# Patient Record
Sex: Male | Born: 1980 | Race: Black or African American | Hispanic: No | Marital: Single | Smoking: Current every day smoker
Health system: Southern US, Community
[De-identification: ages and names within clinical notes are randomized; demographics above are authoritative.]

## PROBLEM LIST (undated history)

## (undated) DIAGNOSIS — W3400XA Accidental discharge from unspecified firearms or gun, initial encounter: Secondary | ICD-10-CM

## (undated) HISTORY — PX: EYE SURGERY: SHX253

---
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
## 2015-05-22 ENCOUNTER — Emergency Department (HOSPITAL_COMMUNITY)
Admission: EM | Admit: 2015-05-22 | Discharge: 2015-05-22 | Disposition: A | Discharge status: Home / Self Care | Payer: Self-pay | Attending: Emergency Medicine | Admitting: Emergency Medicine

## 2015-05-22 ENCOUNTER — Encounter (HOSPITAL_COMMUNITY): Payer: Self-pay

## 2015-05-22 DIAGNOSIS — S41112A Laceration without foreign body of left upper arm, initial encounter: Secondary | ICD-10-CM | POA: Insufficient documentation

## 2015-05-22 DIAGNOSIS — W25XXXA Contact with sharp glass, initial encounter: Secondary | ICD-10-CM | POA: Insufficient documentation

## 2015-05-22 DIAGNOSIS — Y998 Other external cause status: Secondary | ICD-10-CM | POA: Insufficient documentation

## 2015-05-22 DIAGNOSIS — Y9389 Activity, other specified: Secondary | ICD-10-CM | POA: Insufficient documentation

## 2015-05-22 DIAGNOSIS — Y9289 Other specified places as the place of occurrence of the external cause: Secondary | ICD-10-CM | POA: Insufficient documentation

## 2015-05-22 DIAGNOSIS — Z720 Tobacco use: Secondary | ICD-10-CM | POA: Insufficient documentation

## 2015-05-22 HISTORY — DX: Accidental discharge from unspecified firearms or gun, initial encounter: W34.00XA

## 2015-05-22 MED ORDER — LIDOCAINE-EPINEPHRINE 1 %-1:100000 IJ SOLN
10.0000 mL | Freq: Once | INTRAMUSCULAR | Status: AC
Start: 2015-05-22 — End: 2015-05-22
  Administered 2015-05-22: 10 mL
  Filled 2015-05-22: qty 1

## 2015-05-22 MED ORDER — LIDOCAINE-EPINEPHRINE 1 %-1:100000 IJ SOLN: 20.0000 mL | Freq: Once | INTRAMUSCULAR | Status: DC
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
## 2015-05-22 NOTE — ED Notes (Signed)
Pt. From home. Pt. Cut L forearm on broken window glass. Denies pain at this time. Tetanus updated 1 year ago. Bleeding controlled. Pt. Cleaned arm at home prior to EMS arrival.

## 2015-05-22 NOTE — Discharge Instructions (Signed)
Keep wound area clean. Refer to attached documents for more information. Return to the ED with worsening or concerning symptoms.  °

## 2015-12-13 ENCOUNTER — Emergency Department (HOSPITAL_COMMUNITY): Payer: Self-pay

## 2015-12-13 ENCOUNTER — Encounter (HOSPITAL_COMMUNITY): Payer: Self-pay | Admitting: *Deleted

## 2015-12-13 ENCOUNTER — Emergency Department (HOSPITAL_COMMUNITY)
Admission: EM | Admit: 2015-12-13 | Discharge: 2015-12-13 | Disposition: A | Discharge status: Home / Self Care | Payer: Self-pay | Attending: Emergency Medicine | Admitting: Emergency Medicine

## 2015-12-13 DIAGNOSIS — S52592A Other fractures of lower end of left radius, initial encounter for closed fracture: Secondary | ICD-10-CM | POA: Insufficient documentation

## 2015-12-13 DIAGNOSIS — S62102A Fracture of unspecified carpal bone, left wrist, initial encounter for closed fracture: Secondary | ICD-10-CM

## 2015-12-13 DIAGNOSIS — Z87828 Personal history of other (healed) physical injury and trauma: Secondary | ICD-10-CM | POA: Insufficient documentation

## 2015-12-13 DIAGNOSIS — Y9289 Other specified places as the place of occurrence of the external cause: Secondary | ICD-10-CM | POA: Insufficient documentation

## 2015-12-13 DIAGNOSIS — F17200 Nicotine dependence, unspecified, uncomplicated: Secondary | ICD-10-CM | POA: Insufficient documentation

## 2015-12-13 DIAGNOSIS — W1839XA Other fall on same level, initial encounter: Secondary | ICD-10-CM | POA: Insufficient documentation

## 2015-12-13 DIAGNOSIS — Y9389 Activity, other specified: Secondary | ICD-10-CM | POA: Insufficient documentation

## 2015-12-13 DIAGNOSIS — R52 Pain, unspecified: Secondary | ICD-10-CM

## 2015-12-13 DIAGNOSIS — X500XXA Overexertion from strenuous movement or load, initial encounter: Secondary | ICD-10-CM | POA: Insufficient documentation

## 2015-12-13 DIAGNOSIS — N451 Epididymitis: Secondary | ICD-10-CM | POA: Insufficient documentation

## 2015-12-13 DIAGNOSIS — Y998 Other external cause status: Secondary | ICD-10-CM | POA: Insufficient documentation

## 2015-12-13 LAB — CBC WITH DIFFERENTIAL/PLATELET
BASOS ABS: 0 10*3/uL (ref 0.0–0.1)
Basophils Relative: 0 %
EOS PCT: 1 %
Eosinophils Absolute: 0.1 10*3/uL (ref 0.0–0.7)
HCT: 46.9 % (ref 39.0–52.0)
HEMOGLOBIN: 16 g/dL (ref 13.0–17.0)
Lymphocytes Relative: 10 %
Lymphs Abs: 0.9 10*3/uL (ref 0.7–4.0)
MCH: 33.7 pg (ref 26.0–34.0)
MCHC: 34.1 g/dL (ref 30.0–36.0)
MCV: 98.7 fL (ref 78.0–100.0)
Monocytes Absolute: 0.9 10*3/uL (ref 0.1–1.0)
Monocytes Relative: 10 %
Neutro Abs: 7.6 10*3/uL (ref 1.7–7.7)
Neutrophils Relative %: 79 %
PLATELETS: 208 10*3/uL (ref 150–400)
RBC: 4.75 MIL/uL (ref 4.22–5.81)
RDW: 12 % (ref 11.5–15.5)
WBC: 9.5 10*3/uL (ref 4.0–10.5)

## 2015-12-13 LAB — I-STAT CHEM 8, ED
BUN: 10 mg/dL (ref 6–20)
CALCIUM ION: 1.1 mmol/L — AB (ref 1.12–1.23)
CHLORIDE: 99 mmol/L — AB (ref 101–111)
Creatinine, Ser: 1.1 mg/dL (ref 0.61–1.24)
Glucose, Bld: 102 mg/dL — ABNORMAL HIGH (ref 65–99)
HCT: 51 % (ref 39.0–52.0)
Hemoglobin: 17.3 g/dL — ABNORMAL HIGH (ref 13.0–17.0)
POTASSIUM: 3.3 mmol/L — AB (ref 3.5–5.1)
Sodium: 139 mmol/L (ref 135–145)
TCO2: 28 mmol/L (ref 0–100)

## 2015-12-13 LAB — URINALYSIS, ROUTINE W REFLEX MICROSCOPIC
Glucose, UA: NEGATIVE mg/dL
Ketones, ur: 15 mg/dL — AB
Nitrite: NEGATIVE
PH: 7 (ref 5.0–8.0)
Protein, ur: 30 mg/dL — AB
SPECIFIC GRAVITY, URINE: 1.033 — AB (ref 1.005–1.030)

## 2015-12-13 LAB — URINE MICROSCOPIC-ADD ON: BACTERIA UA: NONE SEEN

## 2015-12-13 MED ORDER — DOXYCYCLINE HYCLATE 100 MG PO CAPS: 100.0000 mg | ORAL_CAPSULE | Freq: Two times a day (BID) | ORAL | Status: AC

## 2015-12-13 MED ORDER — AZITHROMYCIN 250 MG PO TABS
1000.0000 mg | ORAL_TABLET | Freq: Once | ORAL | Status: AC
  Administered 2015-12-13: 1000 mg via ORAL
  Filled 2015-12-13: qty 4

## 2015-12-13 MED ORDER — CEFTRIAXONE SODIUM 250 MG IJ SOLR
250.0000 mg | Freq: Once | INTRAMUSCULAR | Status: AC
  Administered 2015-12-13: 250 mg via INTRAMUSCULAR
  Filled 2015-12-13: qty 250

## 2015-12-13 MED ORDER — HYDROCODONE-ACETAMINOPHEN 5-325 MG PO TABS
1.0000 | ORAL_TABLET | Freq: Once | ORAL | Status: AC
  Administered 2015-12-13: 1 via ORAL
  Filled 2015-12-13: qty 1

## 2015-12-13 MED ORDER — LIDOCAINE HCL (PF) 1 % IJ SOLN
INTRAMUSCULAR | Status: AC
  Administered 2015-12-13: 5 mL
  Filled 2015-12-13: qty 5

## 2015-12-13 MED ORDER — HYDROCODONE-ACETAMINOPHEN 5-325 MG PO TABS: 1.0000 | ORAL_TABLET | Freq: Four times a day (QID) | ORAL | Status: AC | PRN

## 2015-12-13 NOTE — Discharge Instructions (Signed)
Take antibiotic as prescribed as treatment for epididymitis.  Also, follow up with hand specialist next week for further management of your broken wrist.  Take pain medication as needed.  Epididymitis Epididymitis is swelling (inflammation) of the epididymis. The epididymis is a cord-like structure that is located along the top and back part of the testicle. It collects and stores sperm from the testicle. This condition can also cause pain and swelling of the testicle and scrotum. Symptoms usually start suddenly (acute epididymitis). Sometimes epididymitis starts gradually and lasts for a while (chronic epididymitis). This type may be harder to treat. CAUSES In men 75 and younger, this condition is usually caused by a bacterial infection or sexually transmitted disease (STD), such as:  Gonorrhea.  Chlamydia.  In men 80 and older who do not have anal sex, this condition is usually caused by bacteria from a blockage or abnormalities in the urinary system. These can result from:  Having a tube placed into the bladder (urinary catheter).  Having an enlarged or inflamed prostate gland.  Having recent urinary tract surgery. In men who have a condition that weakens the body's defense system (immune system), such as HIV, this condition can be caused by:   Other bacteria, including tuberculosis and syphilis.  Viruses.  Fungi. Sometimes this condition occurs without infection. That may happen if urine flows backward into the epididymis after heavy lifting or straining. RISK FACTORS This condition is more likely to develop in men:  Who have unprotected sex with more than one partner.  Who have anal sex.   Who have recently had surgery.   Who have a urinary catheter.  Who have urinary problems.  Who have a suppressed immune system. SYMPTOMS  This condition usually begins suddenly with chills, fever, and pain behind the scrotum and in the testicle. Other symptoms include:    Swelling of the scrotum, testicle, or both.  Pain whenejaculatingor urinating.  Pain in the back or belly.  Nausea.  Itching and discharge from the penis.  Frequent need to pass urine.  Redness and tenderness of the scrotum. DIAGNOSIS Your health care provider can diagnose this condition based on your symptoms and medical history. Your health care provider will also do a physical exam to ask about your symptoms and check your scrotum and testicle for swelling, pain, and redness. You may also have other tests, including:   Examination of discharge from the penis.  Urine tests for infections, such as STDs.  Your health care provider may test you for other STDs, including HIV. TREATMENT Treatment for this condition depends on the cause. If your condition is caused by a bacterial infection, oral antibiotic medicine may be prescribed. If the bacterial infection has spread to your blood, you may need to receive IV antibiotics. Nonbacterial epididymitis is treated with home care that includes bed rest and elevation of the scrotum. Surgery may be needed to treat:  Bacterial epididymitis that causes pus to build up in the scrotum (abscess).  Chronic epididymitis that has not responded to other treatments. HOME CARE INSTRUCTIONS Medicines  Take over-the-counter and prescription medicines only as told by your health care provider.   If you were prescribed an antibiotic medicine, take it as told by your health care provider. Do not stop taking the antibiotic even if your condition improves. Sexual Activity  If your epididymitis was caused by an STD, avoid sexual activity until your treatment is complete.  Inform your sexual partner or partners if you test positive for an STD.  They may need to be treated.Do not engage in sexual activity with your partner or partners until their treatment is completed. General Instructions  Return to your normal activities as told by your  health care provider. Ask your health care provider what activities are safe for you.  Keep your scrotum elevated and supported while resting. Ask your health care provider if you should wear a scrotal support, such as a jockstrap. Wear it as told by your health care provider.  If directed, apply ice to the affected area:   Put ice in a plastic bag.  Place a towel between your skin and the bag.  Leave the ice on for 20 minutes, 2-3 times per day.  Try taking a sitz bath to help with discomfort. This is a warm water bath that is taken while you are sitting down. The water should only come up to your hips and should cover your buttocks. Do this 3-4 times per day or as told by your health care provider.  Keep all follow-up visits as told by your health care provider. This is important. SEEK MEDICAL CARE IF:   You have a fever.   Your pain medicine is not helping.   Your pain is getting worse.   Your symptoms do not improve within three days.   This information is not intended to replace advice given to you by your health care provider. Make sure you discuss any questions you have with your health care provider.   Document Released: 12/13/2000 Document Revised: 09/06/2015 Document Reviewed: 05/03/2015 Elsevier Interactive Patient Education 2016 Elsevier Inc.  Wrist Fracture A wrist fracture is a break or crack in one of the bones of your wrist. Your wrist is made up of eight small bones at the palm of your hand (carpal bones) and two long bones that make up your forearm (radius and ulna). The goal of treatment is to hold the injured bone in place while it heals. Surgery may or may not be needed to care for your injured wrist.  HOME CARE  Keep your injured wrist raised (elevated). Move your fingers as much as you can.  Do not put pressure on any part of your cast or splint. It may break.  Use a plastic bag to protect your cast or splint from water while bathing or showering. Do  not lower your cast or splint into water.  Take medicines only as told by your doctor.  Keep your cast or splint clean and dry. If it gets wet, damaged, or suddenly feels too tight, tell your doctor right away.  Do not use any tobacco products including cigarettes, chewing tobacco, or electronic cigarettes. Tobacco can slow bone healing. If you need help quitting, ask your doctor.  Keep all follow-up visits as told by your doctor. This is important.  Ask your doctor if you should take supplements of calcium and vitamins C and D. GET HELP IF:   Your cast or splint is damaged, breaks, or gets wet.  You have a fever.  You have chills.  You have very bad pain that does not go away.  You have more swelling (inflammation) than before the cast was put on. GET HELP RIGHT AWAY IF:   Your hand or fingernails on the injured arm turn blue or gray, or feel cold or numb.  You lose some feeling in the fingers of your injured arm. MAKE SURE YOU:   Understand these instructions.  Will watch your condition.  Will get help right  away if you are not doing well or get worse.   This information is not intended to replace advice given to you by your health care provider. Make sure you discuss any questions you have with your health care provider.   Document Released: 06/03/2008 Document Revised: 01/06/2015 Document Reviewed: 06/29/2012 Elsevier Interactive Patient Education [employer]2016 Elsevier Inc.

## 2015-12-13 NOTE — ED Notes (Signed)
US at bedside  Pt talking loudly on the phone, talking to someone saying "I'm not playing with your, I will come deep to you".

## 2015-12-13 NOTE — ED Notes (Signed)
Ortho tech at bedside 

## 2015-12-13 NOTE — ED Notes (Signed)
Patient transported to X-ray 

## 2015-12-13 NOTE — ED Notes (Signed)
PA at bedside Pt alert and oriented x4. Respirations even and unlabored, bilateral symmetrical rise and fall of chest. Skin warm and dry. In no acute distress. Denies needs.   

## 2015-12-13 NOTE — ED Notes (Signed)
Unable to collect labs patient is in xray 
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
## 2015-12-13 NOTE — ED Notes (Signed)
Bed: [id number] Expected date:  Expected time:  Means of arrival:  Comments: EMS- groin/leg pain

## 2015-12-13 NOTE — ED Notes (Signed)
US at bedside
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
## 2015-12-14 LAB — GC/CHLAMYDIA PROBE AMP (~~LOC~~) NOT AT ARMC
CHLAMYDIA, DNA PROBE: NEGATIVE
NEISSERIA GONORRHEA: NEGATIVE

## 2015-12-14 LAB — HIV ANTIBODY (ROUTINE TESTING W REFLEX): HIV SCREEN 4TH GENERATION: NONREACTIVE

## 2015-12-14 LAB — RPR: RPR Ser Ql: NONREACTIVE

## 2016-02-08 ENCOUNTER — Emergency Department (HOSPITAL_COMMUNITY): Payer: Self-pay

## 2016-02-08 ENCOUNTER — Encounter (HOSPITAL_COMMUNITY): Payer: Self-pay | Admitting: Emergency Medicine

## 2016-02-08 ENCOUNTER — Emergency Department (HOSPITAL_COMMUNITY)
Admission: EM | Admit: 2016-02-08 | Discharge: 2016-02-08 | Disposition: A | Discharge status: Home / Self Care | Payer: Self-pay | Attending: Emergency Medicine | Admitting: Emergency Medicine

## 2016-02-08 DIAGNOSIS — Y9289 Other specified places as the place of occurrence of the external cause: Secondary | ICD-10-CM | POA: Insufficient documentation

## 2016-02-08 DIAGNOSIS — S5290XA Unspecified fracture of unspecified forearm, initial encounter for closed fracture: Secondary | ICD-10-CM

## 2016-02-08 DIAGNOSIS — S52502A Unspecified fracture of the lower end of left radius, initial encounter for closed fracture: Secondary | ICD-10-CM | POA: Insufficient documentation

## 2016-02-08 DIAGNOSIS — F17200 Nicotine dependence, unspecified, uncomplicated: Secondary | ICD-10-CM | POA: Insufficient documentation

## 2016-02-08 DIAGNOSIS — Z792 Long term (current) use of antibiotics: Secondary | ICD-10-CM | POA: Insufficient documentation

## 2016-02-08 DIAGNOSIS — Y9389 Activity, other specified: Secondary | ICD-10-CM | POA: Insufficient documentation

## 2016-02-08 DIAGNOSIS — Y998 Other external cause status: Secondary | ICD-10-CM | POA: Insufficient documentation

## 2016-02-08 MED ORDER — ACETAMINOPHEN 500 MG PO TABS
1000.0000 mg | ORAL_TABLET | Freq: Once | ORAL | Status: AC
  Administered 2016-02-08: 1000 mg via ORAL
  Filled 2016-02-08: qty 2

## 2016-02-08 NOTE — Progress Notes (Signed)
Orthopedic Tech Progress Note Patient Details:  Antonio Burke [birth year] [id number]  Ortho Devices Type of Ortho Device: Ace wrap, Arm sling, Sugartong splint Splint Material: Fiberglass Ortho Device/Splint Location: LUE Ortho Device/Splint Interventions: Ordered, Application   Jennye Moccasin 02/08/2016, 6:12 PM

## 2016-02-08 NOTE — Discharge Instructions (Signed)
Call to schedule a follow up appointment with Dr. Merlyn Lot.    Forearm Fracture A forearm fracture is a break in one or both of the bones of your arm that are between the elbow and the wrist. Your forearm is made up of two bones:  Radius. This is the bone on the inside of your arm near your thumb.  Ulna. This is the bone on the outside of your arm near your little finger. Middle forearm fractures usually break both the radius and the ulna. Most forearm fractures that involve both the ulna and radius will require surgery. CAUSES Common causes of this type of fracture include:  Falling on an outstretched arm.  Accidents, such as a car or bike accident.  A hard, direct hit to the middle part of your arm. RISK FACTORS You may be at higher risk for this type of fracture if:  You play contact sports.  You have a condition that causes your bones to be weak or thin (osteoporosis). SIGNS AND SYMPTOMS A forearm fracture causes pain immediately after the injury. Other signs and symptoms include:  An abnormal bend or bump in your arm (deformity).  Swelling.  Numbness or tingling.  Tenderness.  Inability to turn your hand from side to side (rotate).  Bruising. DIAGNOSIS Your health care provider may diagnose a forearm fracture based on:  Your symptoms.  Your medical history, including any recent injury.  A physical exam. Your health care provider will look for any deformity and feel for tenderness over the break. Your health care provider will also check whether the bones are out of place.  An X-ray exam to confirm the diagnosis and learn more about the type of fracture. TREATMENT The goals of treatment are to get the bone or bones in proper position for healing and to keep the bones from moving so they will heal over time. Your treatment will depend on many factors, especially the type of fracture that you have.  If the fractured bone or bones:  Are in the correct position  (nondisplaced), you may only need to wear a cast or a splint.  Have a slightly displaced fracture, you may need to have the bones moved back into place manually (closed reduction) before the splint or cast is put on.  You may have a temporary splint before you have a cast. The splint allows room for some swelling. After a few days, a cast can replace the splint.  You may have to wear the cast for 6-8 weeks or as directed by your health care provider.  The cast may be changed after about 3 weeks or as directed by your health care provider.  After your cast is removed, you may need physical therapy to regain full movement in your wrist or elbow.  You may need emergency surgery if you have:  A fractured bone or bones that are out of position (displaced).  A fracture with multiple fragments (comminuted fracture).  A fracture that breaks the skin (open fracture). This type of fracture may require surgical wires, plates, or screws to hold the bone or bones in place.  You may have X-rays every couple of weeks to check on your healing. HOME CARE INSTRUCTIONS If You Have a Cast:  Do not stick anything inside the cast to scratch your skin. Doing that increases your risk of infection.  Check the skin around the cast every day. Report any concerns to your health care provider. You may put lotion on dry skin  around the edges of the cast. Do not apply lotion to the skin underneath the cast. If You Have a Splint:  Wear it as directed by your health care provider. Remove it only as directed by your health care provider.  Loosen the splint if your fingers become numb and tingle, or if they turn cold and blue. Bathing  Cover the cast or splint with a watertight plastic bag to protect it from water while you bathe or shower. Do not let the cast or splint get wet. Managing Pain, Stiffness, and Swelling  If directed, apply ice to the injured area:  Put ice in a plastic bag.  Place a towel between  your skin and the bag.  Leave the ice on for 20 minutes, 2-3 times a day.  Move your fingers often to avoid stiffness and to lessen swelling.  Raise the injured area above the level of your heart while you are sitting or lying down. Driving  Do not drive or operate heavy machinery while taking pain medicine.  Do not drive while wearing a cast or splint on a hand that you use for driving. Activity  Return to your normal activities as directed by your health care provider. Ask your health care provider what activities are safe for you.  Perform range-of-motion exercises only as directed by your health care provider. Safety  Do not use your injured limb to support your body weight until your health care provider says that you can. General Instructions  Do not put pressure on any part of the cast or splint until it is fully hardened. This may take several hours.  Keep the cast or splint clean and dry.  Do not use any tobacco products, including cigarettes, chewing tobacco, or electronic cigarettes. Tobacco can delay bone healing. If you need help quitting, ask your health care provider.  Take medicines only as directed by your health care provider.  Keep all follow-up visits as directed by your health care provider. This is important. SEEK MEDICAL CARE IF:  Your pain medicine is not helping.  Your cast or splint becomes wet or damaged or suddenly feels too tight.  Your cast becomes loose.  You have more severe pain or swelling than you did before the cast.  You have severe pain when you stretch your fingers.  You continue to have pain or stiffness in your elbow or your wrist after your cast is removed. SEEK IMMEDIATE MEDICAL CARE IF:  You cannot move your fingers.  You lose feeling in your fingers or your hand.  Your hand or your fingers turn cold and pale or blue.  You notice a bad smell coming from your cast.  You have drainage from underneath your cast.  You  have new stains from blood or drainage that is coming through your cast.   This information is not intended to replace advice given to you by your health care provider. Make sure you discuss any questions you have with your health care provider.   Document Released: 12/13/2000 Document Revised: 01/06/2015 Document Reviewed: 08/01/2014 Elsevier Interactive Patient Education 2016 [employer]  Cast or Splint Care Casts and splints support injured limbs and keep bones from moving while they heal. It is important to care for your cast or splint at home.  HOME CARE INSTRUCTIONS  Keep the cast or splint uncovered during the drying period. It can take 24 to 48 hours to dry if it is made of plaster. A fiberglass cast will dry in less  than 1 hour.  Do not rest the cast on anything harder than a pillow for the first 24 hours.  Do not put weight on your injured limb or apply pressure to the cast until your health care provider gives you permission.  Keep the cast or splint dry. Wet casts or splints can lose their shape and may not support the limb as well. A wet cast that has lost its shape can also create harmful pressure on your skin when it dries. Also, wet skin can become infected.  Cover the cast or splint with a plastic bag when bathing or when out in the rain or snow. If the cast is on the trunk of the body, take sponge baths until the cast is removed.  If your cast does become wet, dry it with a towel or a blow dryer on the cool setting only.  Keep your cast or splint clean. Soiled casts may be wiped with a moistened cloth.  Do not place any hard or soft foreign objects under your cast or splint, such as cotton, toilet paper, lotion, or powder.  Do not try to scratch the skin under the cast with any object. The object could get stuck inside the cast. Also, scratching could lead to an infection. If itching is a problem, use a blow dryer on a cool setting to relieve discomfort.  Do not  trim or cut your cast or remove padding from inside of it.  Exercise all joints next to the injury that are not immobilized by the cast or splint. For example, if you have a long leg cast, exercise the hip joint and toes. If you have an arm cast or splint, exercise the shoulder, elbow, thumb, and fingers.  Elevate your injured arm or leg on 1 or 2 pillows for the first 1 to 3 days to decrease swelling and pain.It is best if you can comfortably elevate your cast so it is higher than your heart. SEEK MEDICAL CARE IF:   Your cast or splint cracks.  Your cast or splint is too tight or too loose.  You have unbearable itching inside the cast.  Your cast becomes wet or develops a soft spot or area.  You have a bad smell coming from inside your cast.  You get an object stuck under your cast.  Your skin around the cast becomes red or raw.  You have new pain or worsening pain after the cast has been applied. SEEK IMMEDIATE MEDICAL CARE IF:   You have fluid leaking through the cast.  You are unable to move your fingers or toes.  You have discolored (blue or white), cool, painful, or very swollen fingers or toes beyond the cast.  You have tingling or numbness around the injured area.  You have severe pain or pressure under the cast.  You have any difficulty with your breathing or have shortness of breath.  You have chest pain.   This information is not intended to replace advice given to you by your health care provider. Make sure you discuss any questions you have with your health care provider.   Document Released: 12/13/2000 Document Revised: 10/06/2013 Document Reviewed: 06/24/2013 Elsevier Interactive Patient Education [employer]
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
## 2016-02-08 NOTE — ED Notes (Signed)
See PA note for secondary assessment.   

## 2016-02-08 NOTE — ED Notes (Signed)
Pt stated he had a cup of water to drink

## 2016-04-04 ENCOUNTER — Other Ambulatory Visit: Payer: Self-pay | Admitting: Pharmacist Clinician (PhC)/ Clinical Pharmacy Specialist

## 2016-04-04 ENCOUNTER — Other Ambulatory Visit (HOSPITAL_COMMUNITY): Payer: Self-pay | Admitting: Internal Medicine

## 2016-04-04 DIAGNOSIS — S5292XA Unspecified fracture of left forearm, initial encounter for closed fracture: Secondary | ICD-10-CM

## 2016-04-11 ENCOUNTER — Ambulatory Visit (HOSPITAL_COMMUNITY)
Admission: RE | Admit: 2016-04-11 | Discharge: 2016-04-11 | Disposition: A | Discharge status: Home / Self Care | Source: Ambulatory Visit | Attending: Internal Medicine | Admitting: Internal Medicine

## 2016-04-11 DIAGNOSIS — S52502D Unspecified fracture of the lower end of left radius, subsequent encounter for closed fracture with routine healing: Secondary | ICD-10-CM | POA: Insufficient documentation

## 2016-04-11 DIAGNOSIS — X58XXXD Exposure to other specified factors, subsequent encounter: Secondary | ICD-10-CM | POA: Insufficient documentation

## 2016-04-11 DIAGNOSIS — S5292XD Unspecified fracture of left forearm, subsequent encounter for closed fracture with routine healing: Secondary | ICD-10-CM | POA: Diagnosis present

## 2016-04-11 DIAGNOSIS — S5292XA Unspecified fracture of left forearm, initial encounter for closed fracture: Secondary | ICD-10-CM

## 2016-05-28 ENCOUNTER — Emergency Department (HOSPITAL_COMMUNITY)
Admission: EM | Admit: 2016-05-28 | Discharge: 2016-05-29 | Disposition: A | Discharge status: Home / Self Care | Attending: Emergency Medicine | Admitting: Emergency Medicine

## 2016-05-28 ENCOUNTER — Encounter (HOSPITAL_COMMUNITY): Payer: Self-pay | Admitting: *Deleted

## 2016-05-28 DIAGNOSIS — S56922A Laceration of unspecified muscles, fascia and tendons at forearm level, left arm, initial encounter: Secondary | ICD-10-CM

## 2016-05-28 DIAGNOSIS — Y92149 Unspecified place in prison as the place of occurrence of the external cause: Secondary | ICD-10-CM | POA: Insufficient documentation

## 2016-05-28 DIAGNOSIS — X788XXA Intentional self-harm by other sharp object, initial encounter: Secondary | ICD-10-CM | POA: Insufficient documentation

## 2016-05-28 DIAGNOSIS — F329 Major depressive disorder, single episode, unspecified: Secondary | ICD-10-CM | POA: Diagnosis not present

## 2016-05-28 DIAGNOSIS — Y939 Activity, unspecified: Secondary | ICD-10-CM | POA: Insufficient documentation

## 2016-05-28 DIAGNOSIS — Y999 Unspecified external cause status: Secondary | ICD-10-CM | POA: Insufficient documentation

## 2016-05-28 DIAGNOSIS — F17200 Nicotine dependence, unspecified, uncomplicated: Secondary | ICD-10-CM | POA: Diagnosis not present

## 2016-05-28 DIAGNOSIS — S51812A Laceration without foreign body of left forearm, initial encounter: Secondary | ICD-10-CM | POA: Diagnosis not present
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
## 2016-05-29 ENCOUNTER — Encounter (HOSPITAL_COMMUNITY): Payer: Self-pay | Admitting: Emergency Medicine

## 2016-05-29 LAB — COMPREHENSIVE METABOLIC PANEL
ALK PHOS: 68 U/L (ref 38–126)
ALT: 29 U/L (ref 17–63)
ANION GAP: 8 (ref 5–15)
AST: 28 U/L (ref 15–41)
Albumin: 4.6 g/dL (ref 3.5–5.0)
BUN: 8 mg/dL (ref 6–20)
CO2: 26 mmol/L (ref 22–32)
CREATININE: 0.99 mg/dL (ref 0.61–1.24)
Calcium: 9.5 mg/dL (ref 8.9–10.3)
Chloride: 105 mmol/L (ref 101–111)
Glucose, Bld: 95 mg/dL (ref 65–99)
Potassium: 3.5 mmol/L (ref 3.5–5.1)
SODIUM: 139 mmol/L (ref 135–145)
TOTAL PROTEIN: 7.9 g/dL (ref 6.5–8.1)
Total Bilirubin: 0.7 mg/dL (ref 0.3–1.2)

## 2016-05-29 LAB — RAPID URINE DRUG SCREEN, HOSP PERFORMED
AMPHETAMINES: NOT DETECTED
BENZODIAZEPINES: NOT DETECTED
Barbiturates: NOT DETECTED
Cocaine: NOT DETECTED
OPIATES: NOT DETECTED
Tetrahydrocannabinol: NOT DETECTED

## 2016-05-29 LAB — ACETAMINOPHEN LEVEL

## 2016-05-29 LAB — CBC
HCT: 42.3 % (ref 39.0–52.0)
Hemoglobin: 15.1 g/dL (ref 13.0–17.0)
MCH: 33.5 pg (ref 26.0–34.0)
MCHC: 35.7 g/dL (ref 30.0–36.0)
MCV: 93.8 fL (ref 78.0–100.0)
PLATELETS: 198 10*3/uL (ref 150–400)
RBC: 4.51 MIL/uL (ref 4.22–5.81)
RDW: 12.3 % (ref 11.5–15.5)
WBC: 4.9 10*3/uL (ref 4.0–10.5)

## 2016-05-29 LAB — SALICYLATE LEVEL

## 2016-05-29 LAB — ETHANOL

## 2016-05-29 MED ORDER — BACITRACIN ZINC 500 UNIT/GM EX OINT
TOPICAL_OINTMENT | Freq: Every day | CUTANEOUS | Status: DC
  Administered 2016-05-29: 2 via TOPICAL

## 2016-05-29 MED ORDER — LIDOCAINE-EPINEPHRINE-TETRACAINE (LET) SOLUTION
3.0000 mL | Freq: Once | NASAL | Status: AC
  Administered 2016-05-29: 3 mL via TOPICAL
  Filled 2016-05-29: qty 3

## 2016-05-29 MED ORDER — BACITRACIN ZINC 500 UNIT/GM EX OINT
TOPICAL_OINTMENT | CUTANEOUS | Status: AC
  Administered 2016-05-29: 2 via TOPICAL
  Filled 2016-05-29: qty 1.8

## 2016-05-29 MED ORDER — LIDOCAINE-EPINEPHRINE (PF) 2 %-1:200000 IJ SOLN
30.0000 mL | Freq: Once | INTRAMUSCULAR | Status: AC
  Administered 2016-05-29: 30 mL

## 2016-05-29 NOTE — ED Notes (Signed)
Applied bacitracin to patients left form and applied gauzed. Patient forearm was wrapped with kerlix.
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
## 2016-05-29 NOTE — Discharge Instructions (Signed)
Laceration Care, Adult  A laceration is a cut that goes through all layers of the skin. The cut also goes into the tissue that is right under the skin. Some cuts heal on their own. Others need to be closed with stitches (sutures), staples, skin adhesive strips, or wound glue. Taking care of your cut lowers your risk of infection and helps your cut to heal better.  HOW TO TAKE CARE OF YOUR CUT  For stitches or staples:  · Keep the wound clean and dry.  · If you were given a bandage (dressing), you should change it at least one time per day or as told by your doctor. You should also change it if it gets wet or dirty.  · Keep the wound completely dry for the first 24 hours or as told by your doctor. After that time, you may take a shower or a bath. However, make sure that the wound is not soaked in water until after the stitches or staples have been removed.  · Clean the wound one time each day or as told by your doctor:    Wash the wound with soap and water.    Rinse the wound with water until all of the soap comes off.    Pat the wound dry with a clean towel. Do not rub the wound.  · After you clean the wound, put a thin layer of antibiotic ointment on it as told by your doctor. This ointment:    Helps to prevent infection.    Keeps the bandage from sticking to the wound.  · Have your stitches or staples removed as told by your doctor.  If your doctor used skin adhesive strips:   · Keep the wound clean and dry.  · If you were given a bandage, you should change it at least one time per day or as told by your doctor. You should also change it if it gets dirty or wet.  · Do not get the skin adhesive strips wet. You can take a shower or a bath, but be careful to keep the wound dry.  · If the wound gets wet, pat it dry with a clean towel. Do not rub the wound.  · Skin adhesive strips fall off on their own. You can trim the strips as the wound heals. Do not remove any strips that are still stuck to the wound. They will  fall off after a while.  If your doctor used wound glue:  · Try to keep your wound dry, but you may briefly wet it in the shower or bath. Do not soak the wound in water, such as by swimming.  · After you take a shower or a bath, gently pat the wound dry with a clean towel. Do not rub the wound.  · Do not do any activities that will make you really sweaty until the skin glue has fallen off on its own.  · Do not apply liquid, cream, or ointment medicine to your wound while the skin glue is still on.  · If you were given a bandage, you should change it at least one time per day or as told by your doctor. You should also change it if it gets dirty or wet.  · If a bandage is placed over the wound, do not let the tape for the bandage touch the skin glue.  · Do not pick at the glue. The skin glue usually stays on for 5-10 days. Then, it   falls off of the skin.  General Instructions   · To help prevent scarring, make sure to cover your wound with sunscreen whenever you are outside after stitches are removed, after adhesive strips are removed, or when wound glue stays in place and the wound is healed. Make sure to wear a sunscreen of at least 30 SPF.  · Take over-the-counter and prescription medicines only as told by your doctor.  · If you were given antibiotic medicine or ointment, take or apply it as told by your doctor. Do not stop using the antibiotic even if your wound is getting better.  · Do not scratch or pick at the wound.  · Keep all follow-up visits as told by your doctor. This is important.  · Check your wound every day for signs of infection. Watch for:    Redness, swelling, or pain.    Fluid, blood, or pus.  · Raise (elevate) the injured area above the level of your heart while you are sitting or lying down, if possible.  GET HELP IF:  · You got a tetanus shot and you have any of these problems at the injection site:    Swelling.    Very bad pain.    Redness.    Bleeding.  · You have a fever.  · A wound that was  closed breaks open.  · You notice a bad smell coming from your wound or your bandage.  · You notice something coming out of the wound, such as wood or glass.  · Medicine does not help your pain.  · You have more redness, swelling, or pain at the site of your wound.  · You have fluid, blood, or pus coming from your wound.  · You notice a change in the color of your skin near your wound.  · You need to change the bandage often because fluid, blood, or pus is coming from the wound.  · You start to have a new rash.  · You start to have numbness around the wound.  GET HELP RIGHT AWAY IF:  · You have very bad swelling around the wound.  · Your pain suddenly gets worse and is very bad.  · You notice painful lumps near the wound or on skin that is anywhere on your body.  · You have a red streak going away from your wound.  · The wound is on your hand or foot and you cannot move a finger or toe like you usually can.  · The wound is on your hand or foot and you notice that your fingers or toes look pale or bluish.     This information is not intended to replace advice given to you by your health care provider. Make sure you discuss any questions you have with your health care provider.     Document Released: 06/03/2008 Document Revised: 05/02/2015 Document Reviewed: 12/12/2014  [employer]vier Interactive Patient Education ©2016 [employer]

## 2017-08-12 IMAGING — CT CT FOREARM*L* W/O CM
3 of 4 series · 13 of 35 positions shown, 15 images · non-contrast
Comparison: Radiographs 12/13/2015 and 02/08/2016.  CT 02/08/2016.

CLINICAL DATA: Delayed healing of distal radial fracture sustained
12/13/2015. Follow up healing.

EXAM:
CT OF THE LEFT FOREARM WITHOUT CONTRAST
TECHNIQUE: Multidetector CT imaging was performed according to the standard
protocol. Multiplanar CT image reconstructions were also generated.

[Series 4: lfov ext 3.0 i31s 2 · axial · 0.41mm/px · z∈[-585,-357]mm · 5 of 115 slices shown, 7 images]
[im 20/115  soft-tissue]
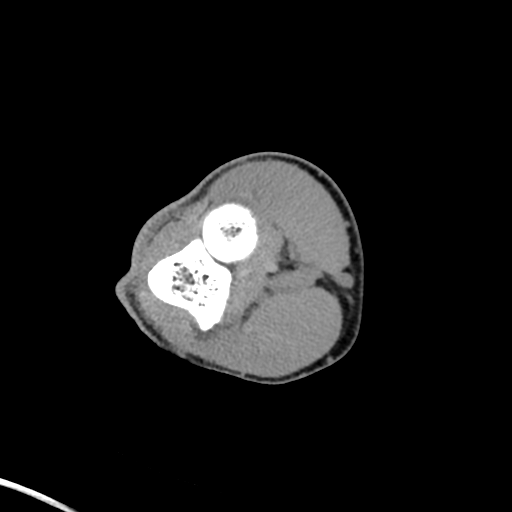
[im 20/115  bone]
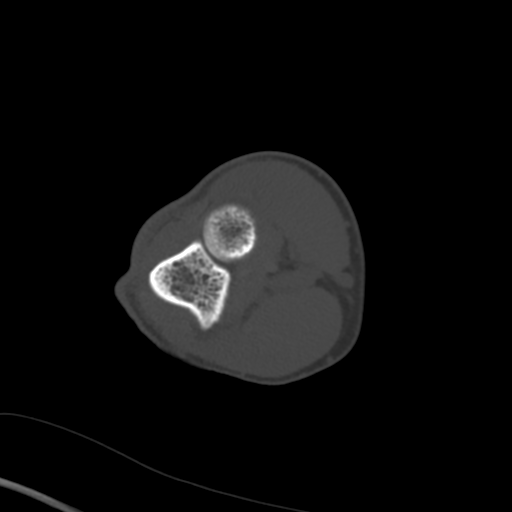
[im 39/115  bone]
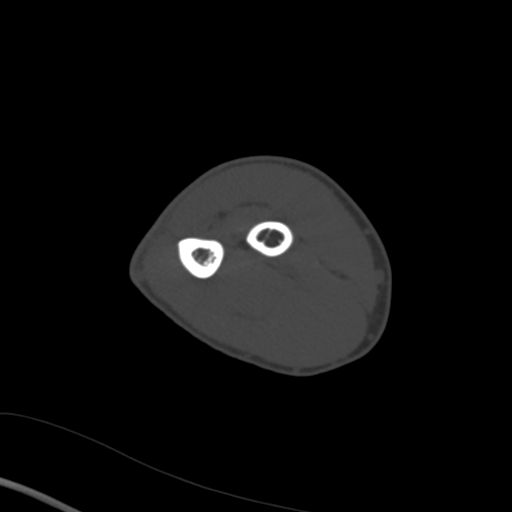
[im 58/115  bone]
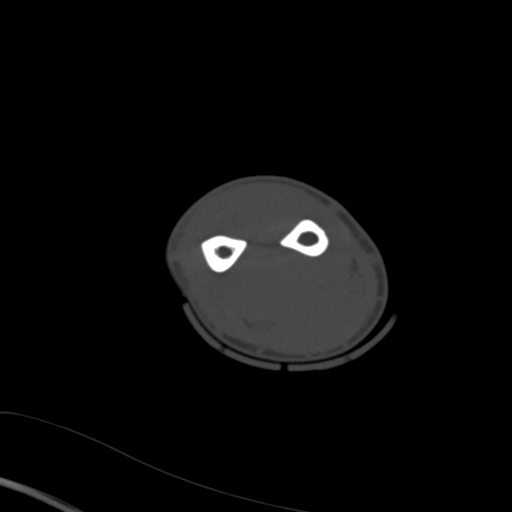
[im 77/115  bone]
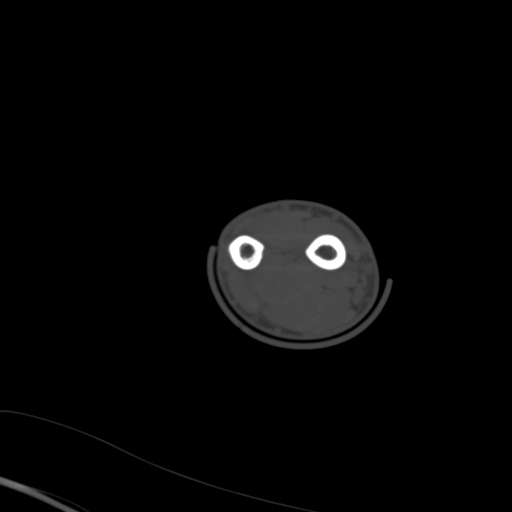
[im 96/115  soft-tissue]
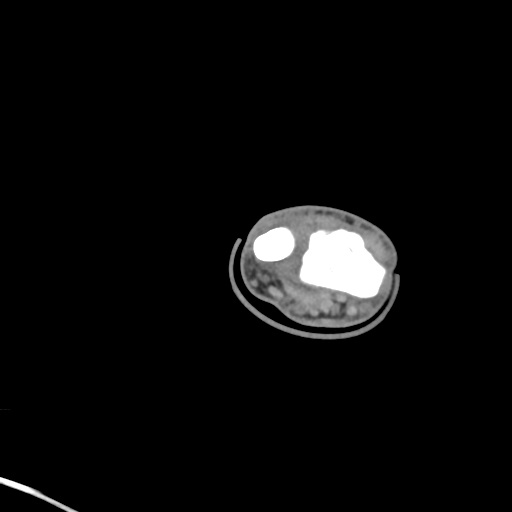
[im 96/115  bone]
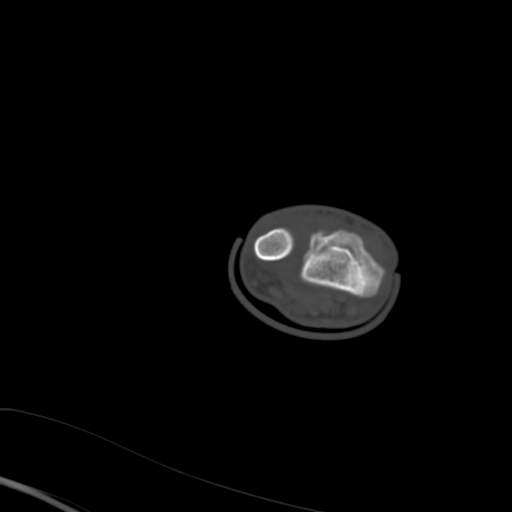

[Series 8: cor st · coronal · 0.37mm/px · 3 of 30 slices shown]
[im 6/30  bone]
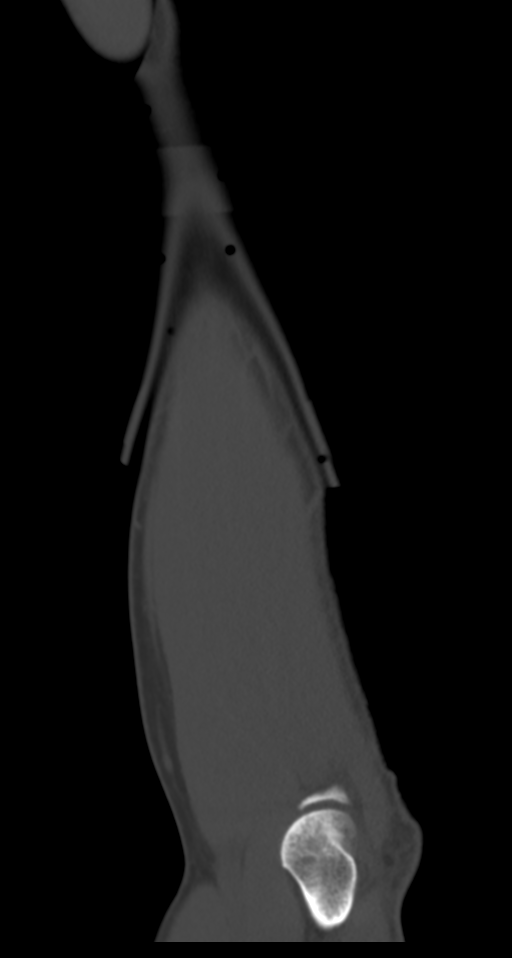
[im 12/30  bone]
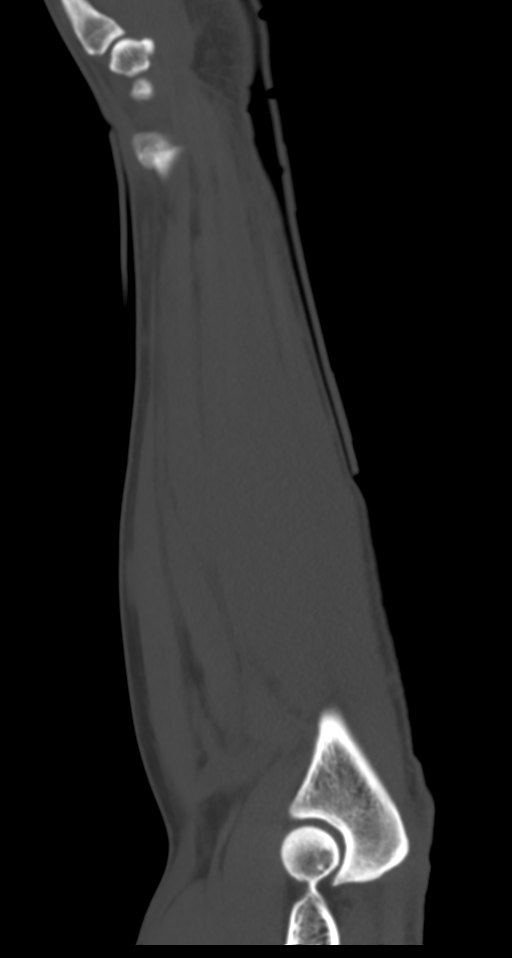
[im 18/30  bone]
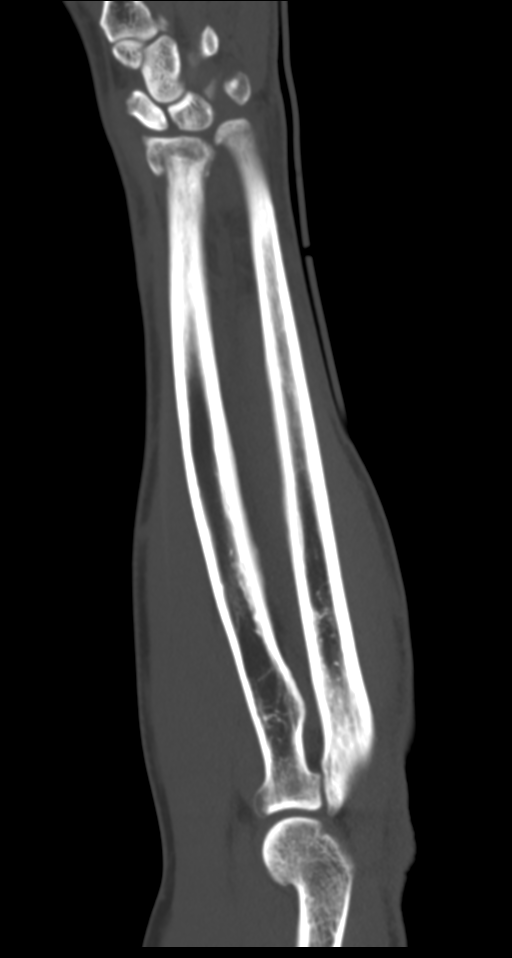

[Series 11: cor bone · axial · 0.25mm/px · z∈[-597,-369]mm · 5 of 117 slices shown]
[im 20/117  bone]
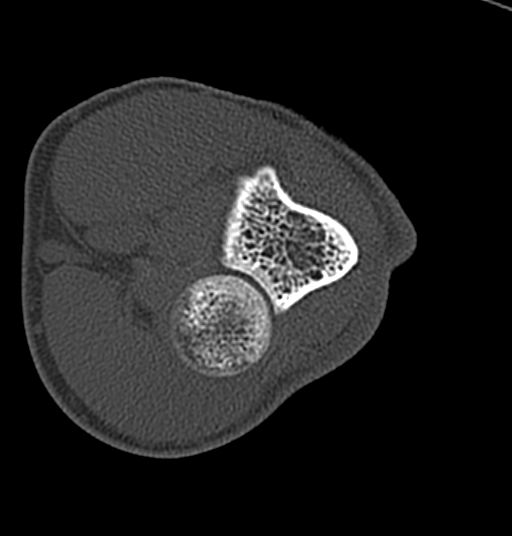
[im 39/117  bone]
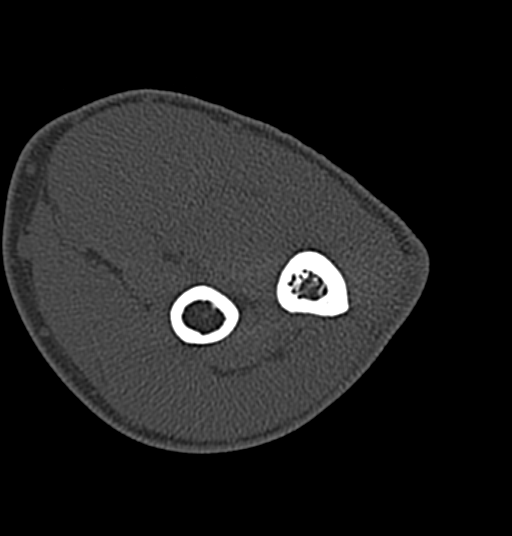
[im 59/117  bone]
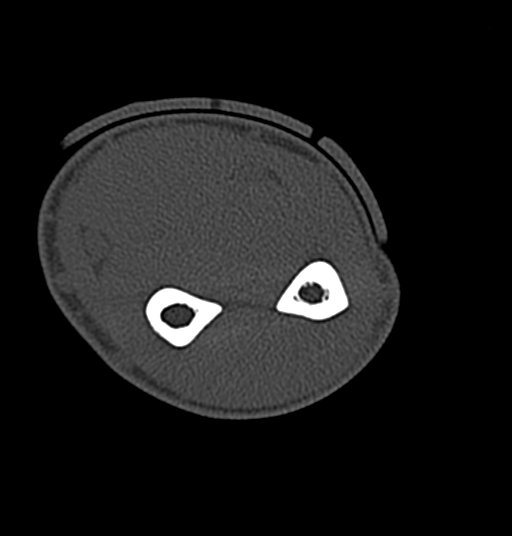
[im 78/117  bone]
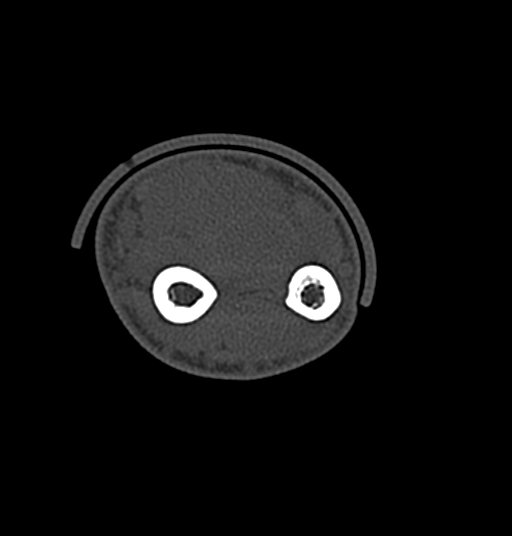
[im 97/117  bone]
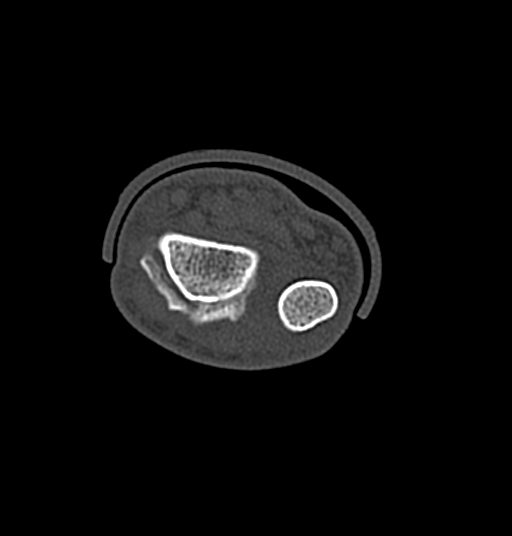

[13 of 35 positions shown; findings below may reference images not displayed]

FINDINGS: There has been interval healing of the comminuted and impacted
intra-articular fracture of the distal radius. There is persistent
radial displacement by 4 mm. There is also mild dorsal displacement
and apex anterior angulation. There is 4 mm of depression of the
articular surface of the distal radius posteriorly, best seen on
sagittal image 18. There is posttraumatic deformity of the sigmoid
notch of the distal radius and mild widening of the distal
radioulnar joint. The distal ulna appears intact.

The carpal bones appear intact and are normally aligned. The entire
forearm was imaged on the current study. No proximal injuries are
identified. There is no dislocation at the elbow. Although the
radial head appears slightly subluxed laterally relative to the
capitellum on the coronal images, the alignment appears normal on
the axial and sagittal images.
IMPRESSION: 1. Interval healing of intra-articular fracture of the distal
radius.
2. There is significant posttraumatic deformity of the distal radius
with articular surface irregularity and widening of the distal
radioulnar joint. This may predispose the patient to premature
radiocarpal arthritis.

## 2017-11-20 IMAGING — US US SCROTUM
1 series · 13 of 25 positions shown · non-contrast
Comparison: None.

CLINICAL DATA: Left testicular pain after moving heavy furniture 2
days ago.

EXAM:
SCROTAL ULTRASOUND
DOPPLER ULTRASOUND OF THE TESTICLES
TECHNIQUE: Complete ultrasound examination of the testicles, epididymis, and
other scrotal structures was performed. Color and spectral Doppler
ultrasound were also utilized to evaluate blood flow to the
testicles.

[Series 1: us scrotum · 0.08mm/px · 13 of 38 slices shown]
[im 1/38]
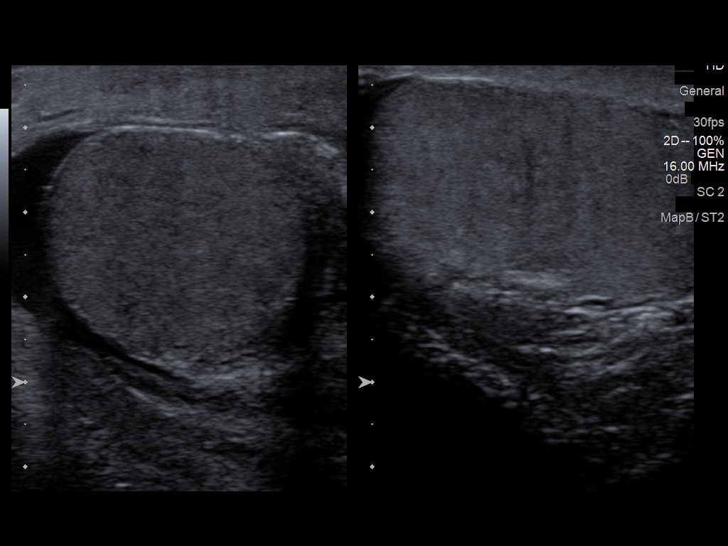
[im 4/38]
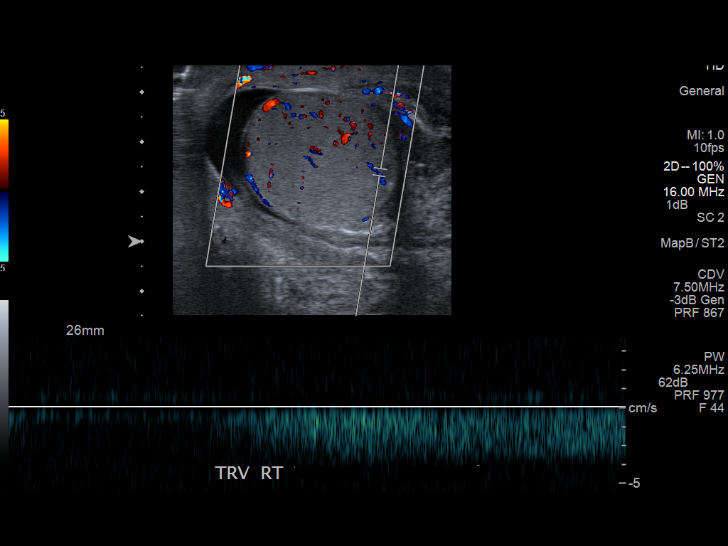
[im 7/38]
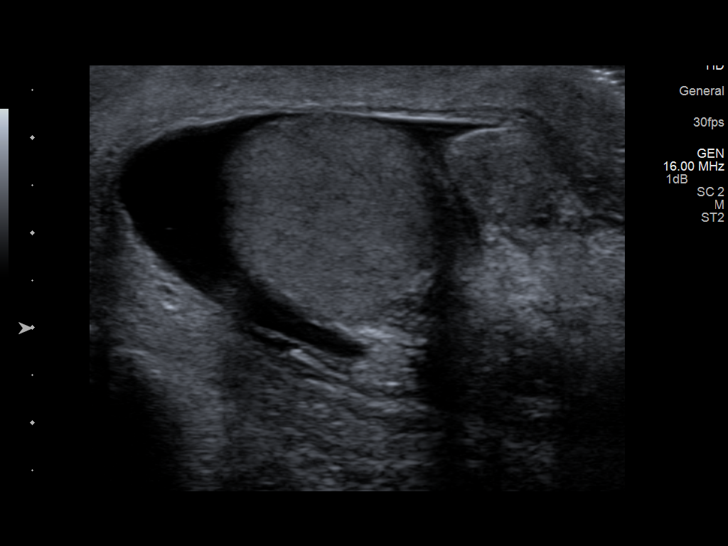
[im 10/38]
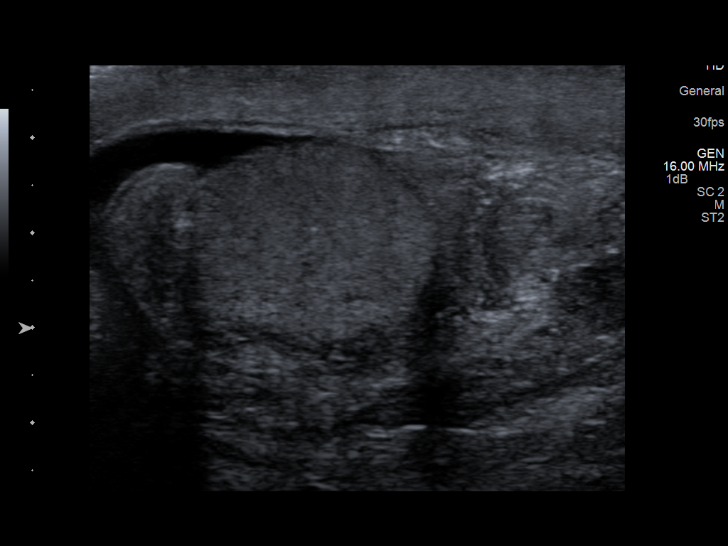
[im 13/38]
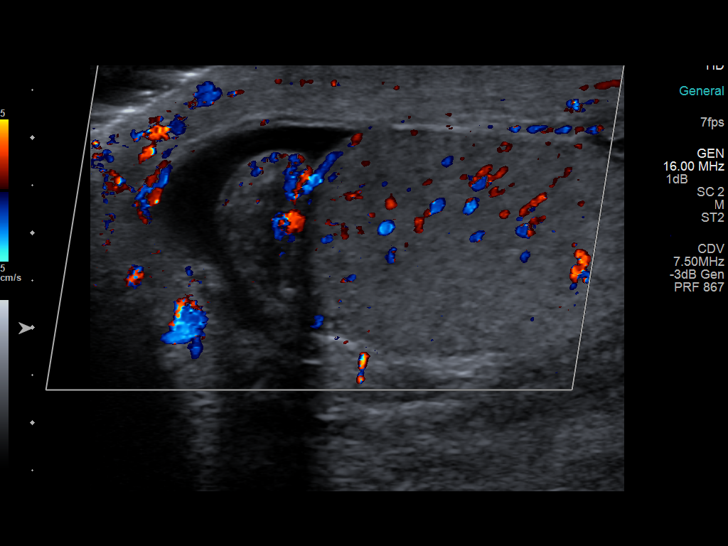
[im 16/38]
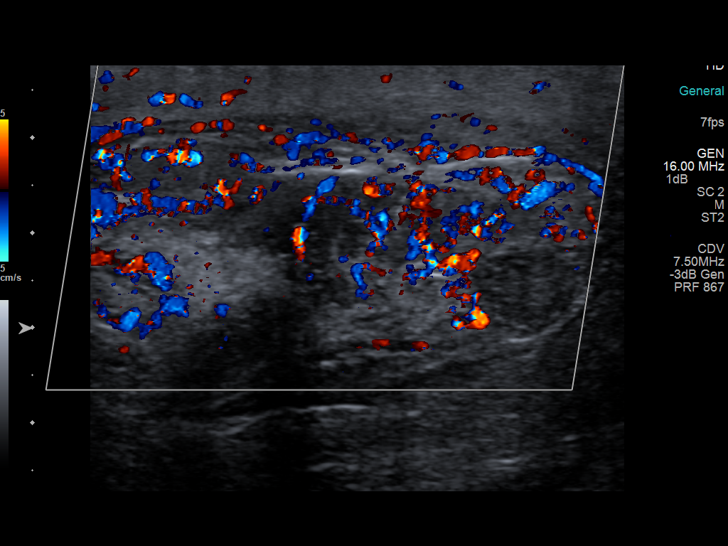
[im 19/38]
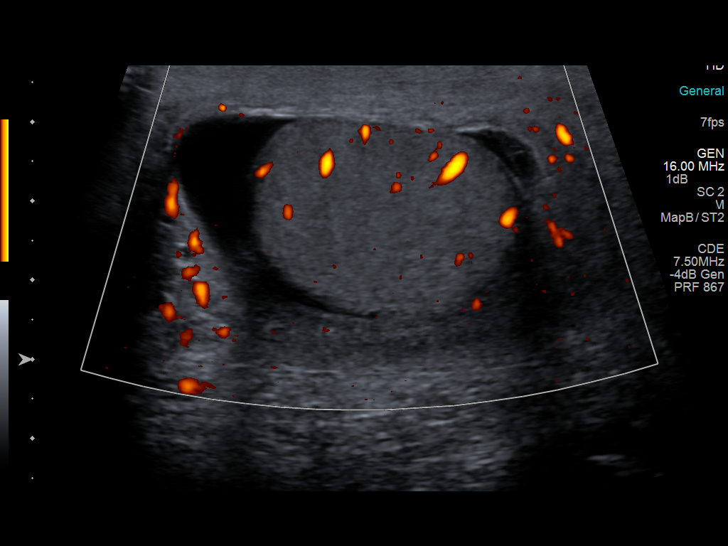
[im 22/38]
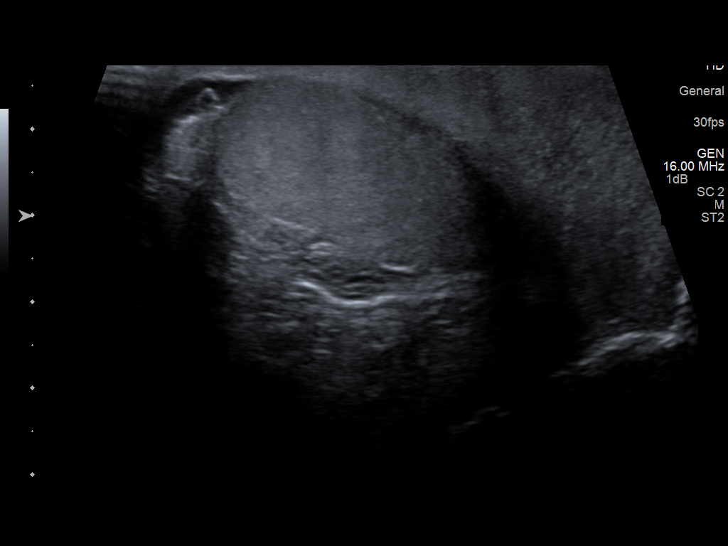
[im 25/38]
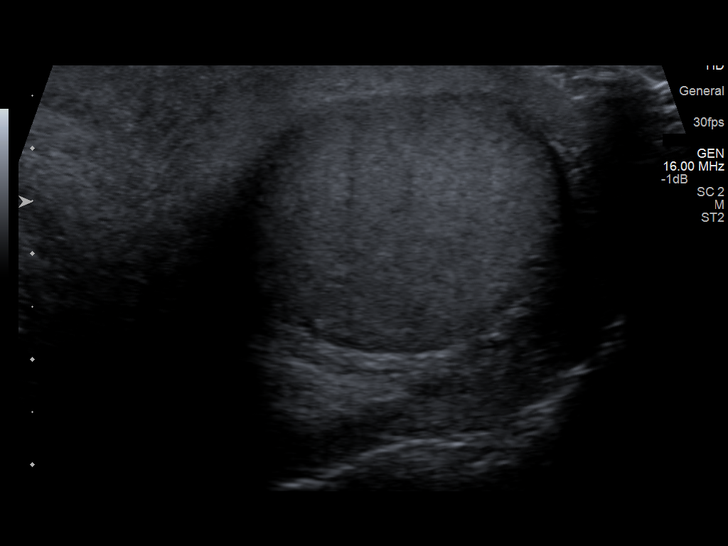
[im 28/38]
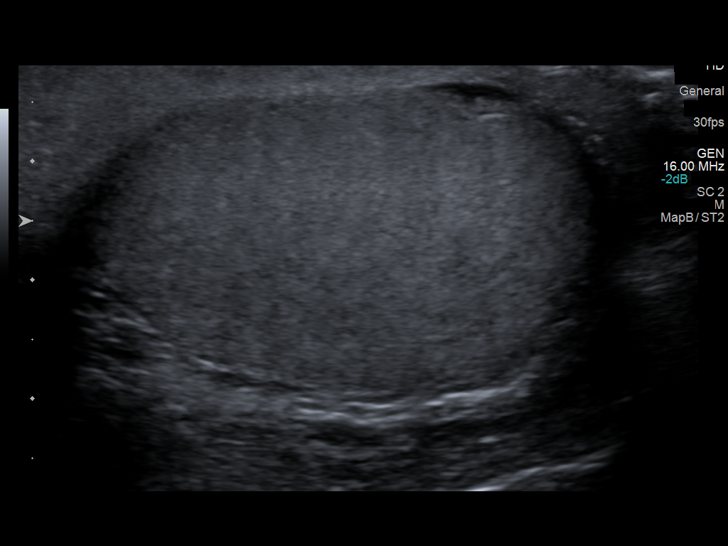
[im 31/38]
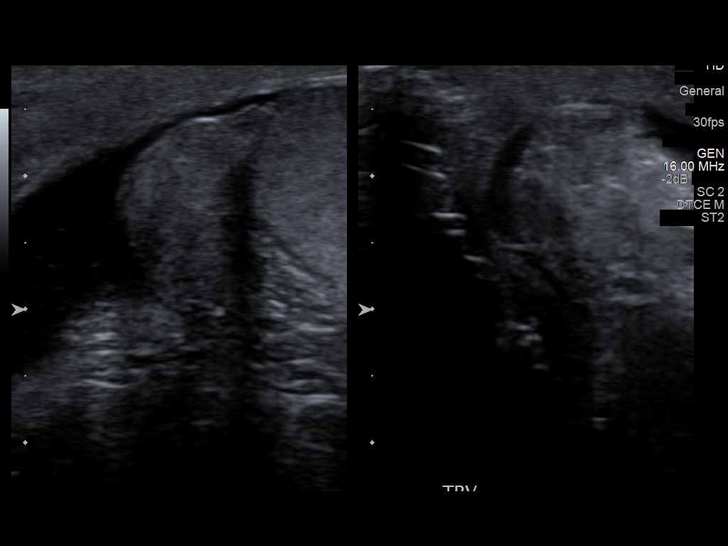
[im 34/38]
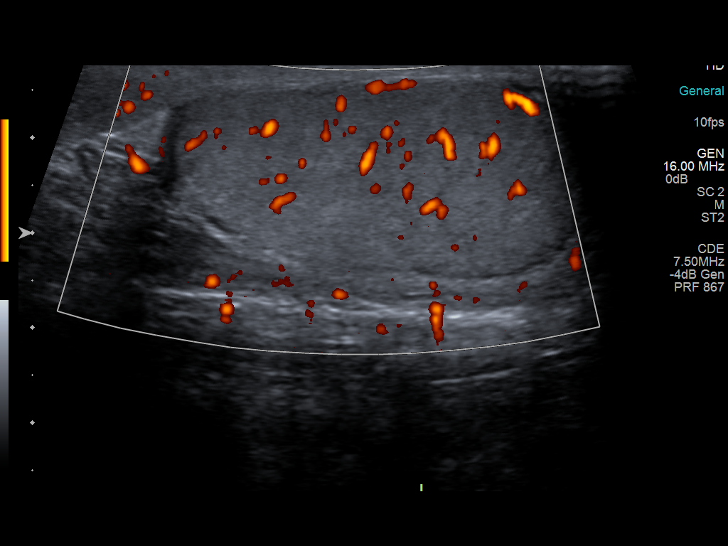
[im 38/38]
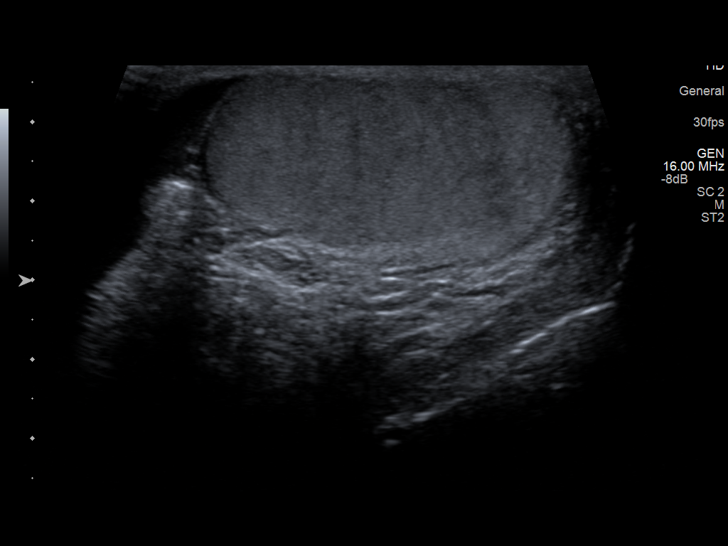

[13 of 25 positions shown; findings below may reference images not displayed]

FINDINGS: Right testicle

Measurements: 3.7 x 2.8 x 3.1 cm. No mass or microlithiasis
visualized.

Left testicle

Measurements: 4.7 x 2.3 x 2.3 cm. No mass or microlithiasis
visualized.

Right epididymis: The right epididymis is enlarged and thickened
with increased flow on color flow Doppler imaging. This suggests
epididymitis.

Left epididymis: Left epididymal cyst or spermatocele measuring
about 5 mm maximal diameter.

Hydrocele:  Small right hydrocele.

Varicocele:  None visualized.

Pulsed Doppler interrogation of both testes demonstrates normal low
resistance arterial and venous waveforms bilaterally.

Color flow Doppler images demonstrate normal and homogeneous flow to
both testes. There is increased flow to the right epididymis.

Other: Diffuse scrotal skin thickening on the right with mild
increased flow on color flow Doppler imaging suggesting cellulitis.
IMPRESSION: Normal appearance of the testicles. No testicular mass or torsion.
Right scrotal skin thickening suggesting cellulitis. Enlarged and
hyperemic right epididymis suggesting epididymitis. Small right
hydrocele.
